# Patient Record
Sex: Female | Born: 1935 | Race: Black or African American | Hispanic: No | Smoking: Never smoker
Health system: Southern US, Community
[De-identification: ages and names within clinical notes are randomized; demographics above are authoritative.]

## PROBLEM LIST (undated history)

## (undated) DIAGNOSIS — I2699 Other pulmonary embolism without acute cor pulmonale: Secondary | ICD-10-CM

---

## 2015-07-30 ENCOUNTER — Emergency Department (HOSPITAL_COMMUNITY): Payer: Medicare HMO

## 2015-07-30 ENCOUNTER — Emergency Department (HOSPITAL_COMMUNITY)
Admission: EM | Admit: 2015-07-30 | Discharge: 2015-07-30 | Disposition: A | Discharge status: Home / Self Care | Payer: Medicare HMO | Attending: Emergency Medicine | Admitting: Emergency Medicine

## 2015-07-30 ENCOUNTER — Encounter (HOSPITAL_COMMUNITY): Payer: Self-pay | Admitting: Emergency Medicine

## 2015-07-30 DIAGNOSIS — Z7901 Long term (current) use of anticoagulants: Secondary | ICD-10-CM | POA: Diagnosis not present

## 2015-07-30 DIAGNOSIS — I2699 Other pulmonary embolism without acute cor pulmonale: Secondary | ICD-10-CM | POA: Diagnosis not present

## 2015-07-30 DIAGNOSIS — R531 Weakness: Secondary | ICD-10-CM | POA: Diagnosis present

## 2015-07-30 DIAGNOSIS — R0602 Shortness of breath: Secondary | ICD-10-CM

## 2015-07-30 HISTORY — DX: Other pulmonary embolism without acute cor pulmonale: I26.99

## 2015-07-30 LAB — HEPATIC FUNCTION PANEL
ALT: 25 U/L (ref 14–54)
AST: 27 U/L (ref 15–41)
Albumin: 3.9 g/dL (ref 3.5–5.0)
Alkaline Phosphatase: 56 U/L (ref 38–126)
BILIRUBIN DIRECT: 0.1 mg/dL (ref 0.1–0.5)
BILIRUBIN INDIRECT: 0.4 mg/dL (ref 0.3–0.9)
BILIRUBIN TOTAL: 0.5 mg/dL (ref 0.3–1.2)
Total Protein: 7.7 g/dL (ref 6.5–8.1)

## 2015-07-30 LAB — CBC
HEMATOCRIT: 43.6 % (ref 36.0–46.0)
HEMOGLOBIN: 14.2 g/dL (ref 12.0–15.0)
MCH: 29.5 pg (ref 26.0–34.0)
MCHC: 32.6 g/dL (ref 30.0–36.0)
MCV: 90.5 fL (ref 78.0–100.0)
Platelets: 341 10*3/uL (ref 150–400)
RBC: 4.82 MIL/uL (ref 3.87–5.11)
RDW: 14.2 % (ref 11.5–15.5)
WBC: 8.8 10*3/uL (ref 4.0–10.5)

## 2015-07-30 LAB — BASIC METABOLIC PANEL
Anion gap: 9 (ref 5–15)
BUN: 14 mg/dL (ref 6–20)
CHLORIDE: 103 mmol/L (ref 101–111)
CO2: 27 mmol/L (ref 22–32)
CREATININE: 1.03 mg/dL — AB (ref 0.44–1.00)
Calcium: 10.2 mg/dL (ref 8.9–10.3)
GFR calc non Af Amer: 51 mL/min — ABNORMAL LOW (ref >60)
GFR, EST AFRICAN AMERICAN: 59 mL/min — AB (ref >60)
Glucose, Bld: 116 mg/dL — ABNORMAL HIGH (ref 65–99)
POTASSIUM: 3.9 mmol/L (ref 3.5–5.1)
Sodium: 139 mmol/L (ref 135–145)

## 2015-07-30 LAB — TROPONIN I: Troponin I: <0.03 ng/mL (ref <0.031)

## 2015-07-30 NOTE — ED Notes (Signed)
Per pt/family-just released from [facility] yesterday-diagnosed with PEs-states she had a bad coughing fit last night-states increased weakness-called PCP and tole her to come here

## 2015-07-30 NOTE — ED Provider Notes (Signed)
CSN: [id number]     Arrival date & time 07/30/15  1234 History   First MD Initiated Contact with Patient 07/30/15 1815     Chief Complaint  Patient presents with  . Weakness     (Consider location/radiation/quality/duration/timing/severity/associated sxs/prior Treatment) HPI Comments: The patient is a 79 year old female, she was recently admitted to an outside hospital with a pulmonary embolism after she was admitted for generalized weakness. The patient states that every time she gets up to walk she feels short of breath and a chest heaviness which immediately goes away when she rests. She lives by herself, she is living by herself for several years, she lives in a one floor house and has no difficulty ambulating except for this difficulty with breathing. She denies any radiation to her neck or her arm, she denies chest pain or shortness of breath at rest, she has no hypertension diabetes hypercholesterolemia and does not smoke cigarettes. She has never had any heart problems in the past, she states that they looked at her heart but she was in the hospital and states that it was normal. The patient denies having any swelling of the legs or any other complaints. She was started on Xarelto at the hospital and continues to take it. It was recommended by her family doctor that she come here for evaluation as she was still having symptoms. At this time the patient has no symptoms.  Patient is a 79 y.o. female presenting with weakness. The history is provided by the patient.  Weakness    Past Medical History  Diagnosis Date  . PE (pulmonary embolism)    History reviewed. No pertinent past surgical history. No family history on file. Social History  Substance Use Topics  . Smoking status: Never Smoker   . Smokeless tobacco: None  . Alcohol Use: No   OB History    No data available     Review of Systems  Neurological: Positive for weakness.  All other systems reviewed and are  negative.     Allergies  Review of patient's allergies indicates no known allergies.  Home Medications   Prior to Admission medications   Medication Sig Start Date End Date Taking? Authorizing Provider  Rivaroxaban (XARELTO) 15 MG TABS tablet Take 15 mg by mouth 2 (two) times daily with a meal.   Yes Historical Provider, MD   BP 144/78 mmHg  Pulse 70  Temp(Src) 98.3 F (36.8 C) (Oral)  Resp 16  SpO2 97% Physical Exam  Constitutional: She appears well-developed and well-nourished. No distress.  HENT:  Head: Normocephalic and atraumatic.  Mouth/Throat: Oropharynx is clear and moist. No oropharyngeal exudate.  Eyes: Conjunctivae and EOM are normal. Pupils are equal, round, and reactive to light. Right eye exhibits no discharge. Left eye exhibits no discharge. No scleral icterus.  Neck: Normal range of motion. Neck supple. No JVD present. No thyromegaly present.  Cardiovascular: Normal rate, regular rhythm, normal heart sounds and intact distal pulses.  Exam reveals no gallop and no friction rub.   No murmur heard. Pulmonary/Chest: Effort normal and breath sounds normal. No respiratory distress. She has no wheezes. She has no rales.  Abdominal: Soft. Bowel sounds are normal. She exhibits no distension and no mass. There is no tenderness.  Musculoskeletal: Normal range of motion. She exhibits no edema or tenderness.  Lymphadenopathy:    She has no cervical adenopathy.  Neurological: She is alert. Coordination normal.  Skin: Skin is warm and dry. No rash noted. No erythema.  Psychiatric: She has a normal mood and affect. Her behavior is normal.  Nursing note and vitals reviewed.   ED Course  Procedures (including critical care time) Labs Review Labs Reviewed  BASIC METABOLIC PANEL - Abnormal; Notable for the following:    Glucose, Bld 116 (*)    Creatinine, Ser 1.03 (*)    GFR calc non Af Amer 51 (*)    GFR calc Af Amer 59 (*)    All other components within normal limits   CBC  HEPATIC FUNCTION PANEL  TROPONIN I    Imaging Review Dg Chest 2 View  07/30/2015  CLINICAL DATA:  Weakness shortness of breath. Cough for 3-4 weeks. Diagnosis of pulmonary embolism 3 days ago at [facility]. EXAM: CHEST - 2 VIEW COMPARISON:  [facility] FINDINGS: The heart size is normal. The lungs are clear. Mild hyperinflation is stable. There is no edema or effusion. No focal airspace consolidation is present. The visualized soft tissues and bony thorax are unremarkable. IMPRESSION: No acute cardiopulmonary disease or significant interval change. Electronically Signed   By: Marin Robertshristopher  Mattern M.D.   On: 07/30/2015 14:09   I have personally reviewed and evaluated these images and lab results as part of my medical decision-making.   EKG Interpretation   Date/Time:  Tuesday July 30 2015 12:46:52 EST Ventricular Rate:  71 PR Interval:  140 QRS Duration: 69 QT Interval:  408 QTC Calculation: 443 R Axis:   -8 Text Interpretation:  Sinus rhythm Multiple ventricular premature  complexes Left atrial enlargement No old tracing to compare Confirmed by  Eshika Reckart  MD, Felcia Huebert ([id number]) on 07/30/2015 6:17:25 PM      MDM   Final diagnoses:  Shortness of breath  Other pulmonary embolism without acute cor pulmonale, unspecified chronicity (HCC)    No tachycardia, soft nontender abdomen, no edema, clear lung sounds. X-ray is negative, labs so far are normal, reviewed CT scan of the chest abdomen and pelvis from the outside hospital showing pulmonary embolus him in the right lung, also showing no acute findings in the abdomen and pelvis. Her vital signs are normal, the patient should be stable for discharge, I have discussed this extensively with the family and the patient, they agreed to have her live with them for the next couple of weeks while she gradually improves. I suspect that her dyspnea and chest discomfort is related to the pulmonary embolism  and not to a primary cardiac disease. Her EKG is unremarkable.  The patient has normal lab values, normal x-ray, normal vital signs, she appears comfortable and well rested and is stable for discharge. She will be discharged in continuing to take her Xarelto. She and her daughter are in agreement that this is the right choice, she can follow-up in the outpatient setting.    Eber HongBrian Jeremie Abdelaziz, MD 07/30/15 [phone]

## 2015-07-30 NOTE — Discharge Instructions (Signed)
Your testing was normal Normal blood tests and xray See your doctor in 48 hours  Please obtain all of your results from medical records or have your doctors office obtain the results - share them with your doctor - you should be seen at your doctors office in the next 2 days. Call today to arrange your follow up. Take the medications as prescribed. Please review all of the medicines and only take them if you do not have an allergy to them. Please be aware that if you are taking birth control pills, taking other prescriptions, ESPECIALLY ANTIBIOTICS may make the birth control ineffective - if this is the case, either do not engage in sexual activity or use alternative methods of birth control such as condoms until you have finished the medicine and your family doctor says it is OK to restart them. If you are on a blood thinner such as COUMADIN, be aware that any other medicine that you take may cause the coumadin to either work too much, or not enough - you should have your coumadin level rechecked in next 7 days if this is the case.  ?  It is also a possibility that you have an allergic reaction to any of the medicines that you have been prescribed - Everybody reacts differently to medications and while MOST people have no trouble with most medicines, you may have a reaction such as nausea, vomiting, rash, swelling, shortness of breath. If this is the case, please stop taking the medicine immediately and contact your physician.  ?  You should return to the ER if you develop severe or worsening symptoms.

## 2015-09-18 DIAGNOSIS — I2699 Other pulmonary embolism without acute cor pulmonale: Secondary | ICD-10-CM | POA: Insufficient documentation

## 2015-09-18 DIAGNOSIS — I313 Pericardial effusion (noninflammatory): Secondary | ICD-10-CM | POA: Insufficient documentation

## 2015-09-18 DIAGNOSIS — I3139 Other pericardial effusion (noninflammatory): Secondary | ICD-10-CM | POA: Insufficient documentation

## 2015-10-04 DIAGNOSIS — I10 Essential (primary) hypertension: Secondary | ICD-10-CM | POA: Insufficient documentation

## 2015-11-06 DIAGNOSIS — R5383 Other fatigue: Secondary | ICD-10-CM | POA: Insufficient documentation

## 2015-11-06 DIAGNOSIS — G4700 Insomnia, unspecified: Secondary | ICD-10-CM | POA: Insufficient documentation

## 2015-11-06 DIAGNOSIS — I517 Cardiomegaly: Secondary | ICD-10-CM | POA: Insufficient documentation

## 2015-11-06 DIAGNOSIS — I2510 Atherosclerotic heart disease of native coronary artery without angina pectoris: Secondary | ICD-10-CM | POA: Insufficient documentation

## 2015-11-06 DIAGNOSIS — M1711 Unilateral primary osteoarthritis, right knee: Secondary | ICD-10-CM | POA: Insufficient documentation

## 2015-11-06 DIAGNOSIS — M25552 Pain in left hip: Secondary | ICD-10-CM | POA: Insufficient documentation

## 2015-11-06 DIAGNOSIS — M1610 Unilateral primary osteoarthritis, unspecified hip: Secondary | ICD-10-CM | POA: Insufficient documentation

## 2015-11-27 DIAGNOSIS — R42 Dizziness and giddiness: Secondary | ICD-10-CM | POA: Insufficient documentation

## 2016-01-09 DIAGNOSIS — R350 Frequency of micturition: Secondary | ICD-10-CM | POA: Insufficient documentation

## 2016-01-09 DIAGNOSIS — Z7901 Long term (current) use of anticoagulants: Secondary | ICD-10-CM | POA: Insufficient documentation

## 2016-01-16 DIAGNOSIS — R0982 Postnasal drip: Secondary | ICD-10-CM

## 2016-01-16 DIAGNOSIS — J309 Allergic rhinitis, unspecified: Secondary | ICD-10-CM | POA: Insufficient documentation

## 2016-03-05 DIAGNOSIS — H9312 Tinnitus, left ear: Secondary | ICD-10-CM | POA: Insufficient documentation

## 2016-07-08 DIAGNOSIS — M7918 Myalgia, other site: Secondary | ICD-10-CM | POA: Insufficient documentation

## 2016-07-08 DIAGNOSIS — R1013 Epigastric pain: Secondary | ICD-10-CM | POA: Insufficient documentation

## 2016-07-19 DIAGNOSIS — F0390 Unspecified dementia without behavioral disturbance: Secondary | ICD-10-CM | POA: Insufficient documentation

## 2016-08-20 DIAGNOSIS — I480 Paroxysmal atrial fibrillation: Secondary | ICD-10-CM | POA: Insufficient documentation

## 2016-08-20 DIAGNOSIS — M79621 Pain in right upper arm: Secondary | ICD-10-CM | POA: Insufficient documentation

## 2017-05-18 DIAGNOSIS — R1031 Right lower quadrant pain: Secondary | ICD-10-CM | POA: Insufficient documentation

## 2017-07-22 IMAGING — CR DG CHEST 2V
2 series · 2 of 2 positions shown · non-contrast
Comparison: [facility]

CLINICAL DATA: Weakness shortness of breath. Cough for 3-4 weeks.
Diagnosis of pulmonary embolism 3 days ago at [facility].

EXAM:
CHEST - 2 VIEW

[w chest pa]
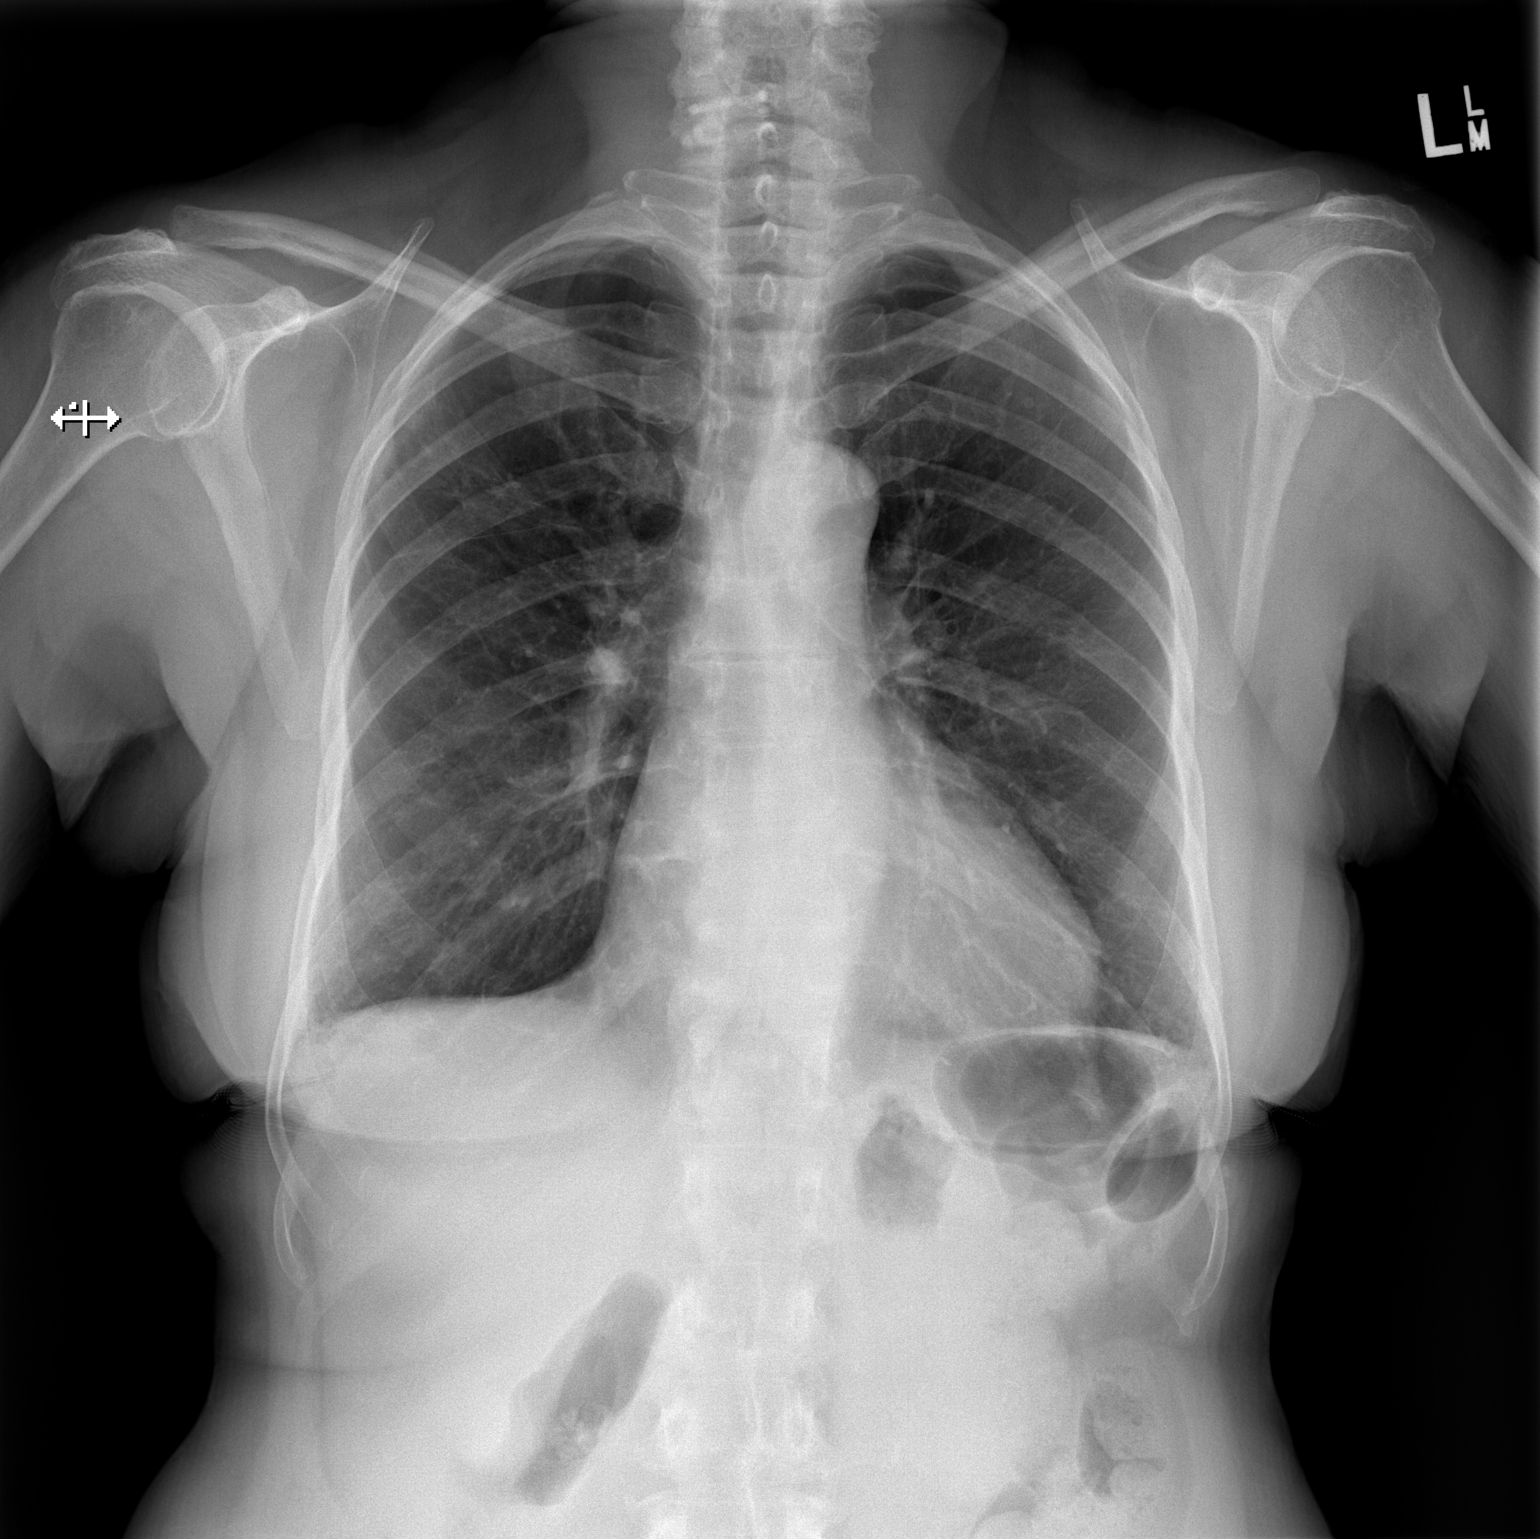

[w chest lat]
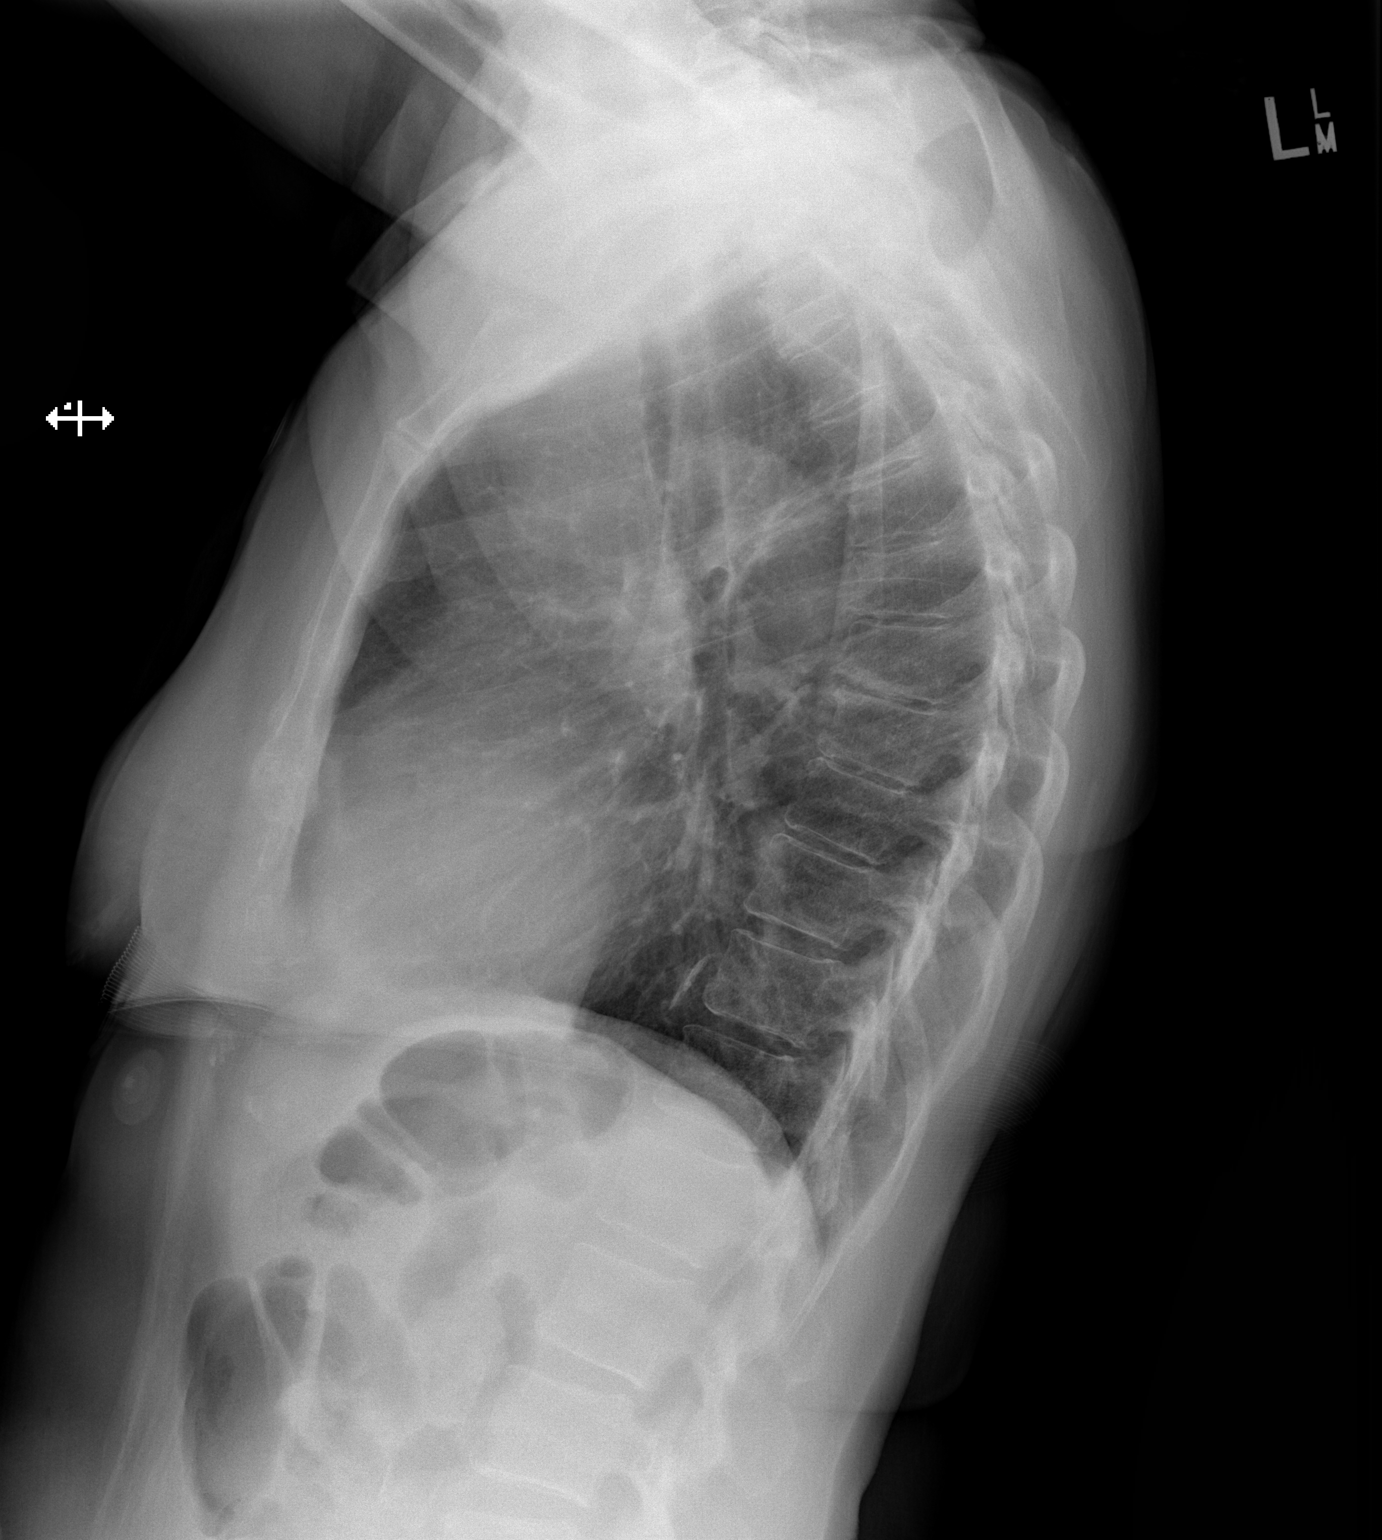

[2 of 2 positions shown; findings below may reference images not displayed]

FINDINGS: The heart size is normal. The lungs are clear. Mild hyperinflation
is stable. There is no edema or effusion. No focal airspace
consolidation is present. The visualized soft tissues and bony
thorax are unremarkable.
IMPRESSION: No acute cardiopulmonary disease or significant interval change.

## 2017-09-22 DIAGNOSIS — G8929 Other chronic pain: Secondary | ICD-10-CM | POA: Insufficient documentation

## 2017-09-22 DIAGNOSIS — M546 Pain in thoracic spine: Secondary | ICD-10-CM

## 2017-12-17 DIAGNOSIS — M25552 Pain in left hip: Secondary | ICD-10-CM

## 2017-12-17 DIAGNOSIS — M25551 Pain in right hip: Secondary | ICD-10-CM | POA: Insufficient documentation

## 2018-04-07 DIAGNOSIS — R079 Chest pain, unspecified: Secondary | ICD-10-CM | POA: Insufficient documentation

## 2018-05-03 DIAGNOSIS — E785 Hyperlipidemia, unspecified: Secondary | ICD-10-CM | POA: Insufficient documentation

## 2018-07-20 DIAGNOSIS — K2100 Gastro-esophageal reflux disease with esophagitis, without bleeding: Secondary | ICD-10-CM | POA: Insufficient documentation

## 2018-09-11 DIAGNOSIS — I160 Hypertensive urgency: Secondary | ICD-10-CM | POA: Diagnosis not present

## 2018-09-11 DIAGNOSIS — Z7901 Long term (current) use of anticoagulants: Secondary | ICD-10-CM | POA: Diagnosis not present

## 2018-09-11 DIAGNOSIS — R079 Chest pain, unspecified: Secondary | ICD-10-CM | POA: Diagnosis not present

## 2018-09-11 DIAGNOSIS — I4891 Unspecified atrial fibrillation: Secondary | ICD-10-CM | POA: Diagnosis not present

## 2018-09-12 ENCOUNTER — Ambulatory Visit: Payer: Medicare HMO | Admitting: Cardiology

## 2018-09-12 DIAGNOSIS — Z86711 Personal history of pulmonary embolism: Secondary | ICD-10-CM | POA: Insufficient documentation

## 2018-09-12 DIAGNOSIS — R079 Chest pain, unspecified: Secondary | ICD-10-CM | POA: Diagnosis not present

## 2018-09-20 DIAGNOSIS — K921 Melena: Secondary | ICD-10-CM | POA: Diagnosis not present

## 2018-09-20 DIAGNOSIS — R1084 Generalized abdominal pain: Secondary | ICD-10-CM | POA: Diagnosis not present

## 2018-09-22 DIAGNOSIS — K921 Melena: Secondary | ICD-10-CM | POA: Diagnosis not present

## 2018-09-26 ENCOUNTER — Ambulatory Visit: Payer: Self-pay | Admitting: Cardiology

## 2018-09-28 DIAGNOSIS — K921 Melena: Secondary | ICD-10-CM | POA: Diagnosis not present

## 2018-09-28 DIAGNOSIS — K2950 Unspecified chronic gastritis without bleeding: Secondary | ICD-10-CM | POA: Diagnosis not present

## 2018-09-28 DIAGNOSIS — R1084 Generalized abdominal pain: Secondary | ICD-10-CM | POA: Diagnosis not present

## 2018-09-28 DIAGNOSIS — K2930 Chronic superficial gastritis without bleeding: Secondary | ICD-10-CM | POA: Diagnosis not present

## 2018-09-28 DIAGNOSIS — R1013 Epigastric pain: Secondary | ICD-10-CM | POA: Diagnosis not present

## 2018-10-03 DIAGNOSIS — R1084 Generalized abdominal pain: Secondary | ICD-10-CM | POA: Diagnosis not present

## 2018-10-03 DIAGNOSIS — K824 Cholesterolosis of gallbladder: Secondary | ICD-10-CM | POA: Diagnosis not present

## 2018-11-04 DIAGNOSIS — I2510 Atherosclerotic heart disease of native coronary artery without angina pectoris: Secondary | ICD-10-CM | POA: Diagnosis not present

## 2018-11-04 DIAGNOSIS — E785 Hyperlipidemia, unspecified: Secondary | ICD-10-CM | POA: Diagnosis not present

## 2018-11-04 DIAGNOSIS — Z7901 Long term (current) use of anticoagulants: Secondary | ICD-10-CM | POA: Diagnosis not present

## 2018-11-04 DIAGNOSIS — I313 Pericardial effusion (noninflammatory): Secondary | ICD-10-CM | POA: Diagnosis not present

## 2018-11-04 DIAGNOSIS — I2782 Chronic pulmonary embolism: Secondary | ICD-10-CM | POA: Diagnosis not present

## 2018-11-04 DIAGNOSIS — I480 Paroxysmal atrial fibrillation: Secondary | ICD-10-CM | POA: Diagnosis not present

## 2018-11-10 DIAGNOSIS — I313 Pericardial effusion (noninflammatory): Secondary | ICD-10-CM | POA: Diagnosis not present

## 2018-11-16 DIAGNOSIS — R070 Pain in throat: Secondary | ICD-10-CM | POA: Diagnosis not present

## 2018-11-16 DIAGNOSIS — R1310 Dysphagia, unspecified: Secondary | ICD-10-CM | POA: Diagnosis not present

## 2018-11-16 DIAGNOSIS — J342 Deviated nasal septum: Secondary | ICD-10-CM | POA: Diagnosis not present

## 2019-02-02 DIAGNOSIS — Z87891 Personal history of nicotine dependence: Secondary | ICD-10-CM | POA: Diagnosis not present

## 2019-02-02 DIAGNOSIS — M79622 Pain in left upper arm: Secondary | ICD-10-CM | POA: Diagnosis not present

## 2019-02-09 DIAGNOSIS — Z87891 Personal history of nicotine dependence: Secondary | ICD-10-CM | POA: Diagnosis not present

## 2019-02-09 DIAGNOSIS — Z0000 Encounter for general adult medical examination without abnormal findings: Secondary | ICD-10-CM | POA: Diagnosis not present

## 2019-02-09 DIAGNOSIS — M79621 Pain in right upper arm: Secondary | ICD-10-CM | POA: Diagnosis not present

## 2019-02-09 DIAGNOSIS — F0390 Unspecified dementia without behavioral disturbance: Secondary | ICD-10-CM | POA: Diagnosis not present

## 2019-02-09 DIAGNOSIS — M79622 Pain in left upper arm: Secondary | ICD-10-CM | POA: Diagnosis not present

## 2019-02-16 DIAGNOSIS — M542 Cervicalgia: Secondary | ICD-10-CM | POA: Diagnosis not present

## 2019-02-16 DIAGNOSIS — M5412 Radiculopathy, cervical region: Secondary | ICD-10-CM | POA: Diagnosis not present

## 2019-02-27 DIAGNOSIS — N390 Urinary tract infection, site not specified: Secondary | ICD-10-CM | POA: Diagnosis not present

## 2019-02-27 DIAGNOSIS — Z7901 Long term (current) use of anticoagulants: Secondary | ICD-10-CM | POA: Diagnosis not present

## 2019-02-27 DIAGNOSIS — R300 Dysuria: Secondary | ICD-10-CM | POA: Diagnosis not present

## 2019-02-27 DIAGNOSIS — R319 Hematuria, unspecified: Secondary | ICD-10-CM | POA: Diagnosis not present

## 2019-03-10 DIAGNOSIS — Z87891 Personal history of nicotine dependence: Secondary | ICD-10-CM | POA: Diagnosis not present

## 2019-03-10 DIAGNOSIS — I313 Pericardial effusion (noninflammatory): Secondary | ICD-10-CM | POA: Diagnosis not present

## 2019-03-10 DIAGNOSIS — Z7901 Long term (current) use of anticoagulants: Secondary | ICD-10-CM | POA: Diagnosis not present

## 2019-03-10 DIAGNOSIS — E785 Hyperlipidemia, unspecified: Secondary | ICD-10-CM | POA: Diagnosis not present

## 2019-03-10 DIAGNOSIS — I2510 Atherosclerotic heart disease of native coronary artery without angina pectoris: Secondary | ICD-10-CM | POA: Diagnosis not present

## 2019-03-10 DIAGNOSIS — I480 Paroxysmal atrial fibrillation: Secondary | ICD-10-CM | POA: Diagnosis not present

## 2019-05-01 DIAGNOSIS — R002 Palpitations: Secondary | ICD-10-CM | POA: Diagnosis not present

## 2022-10-27 DIAGNOSIS — R0789 Other chest pain: Secondary | ICD-10-CM | POA: Diagnosis not present

## 2022-10-27 DIAGNOSIS — G8929 Other chronic pain: Secondary | ICD-10-CM | POA: Diagnosis not present

## 2022-10-27 DIAGNOSIS — M545 Low back pain, unspecified: Secondary | ICD-10-CM | POA: Diagnosis not present

## 2023-02-05 DIAGNOSIS — I3139 Other pericardial effusion (noninflammatory): Secondary | ICD-10-CM | POA: Diagnosis not present

## 2023-02-05 DIAGNOSIS — I2510 Atherosclerotic heart disease of native coronary artery without angina pectoris: Secondary | ICD-10-CM | POA: Diagnosis not present

## 2023-02-05 DIAGNOSIS — E785 Hyperlipidemia, unspecified: Secondary | ICD-10-CM | POA: Diagnosis not present

## 2023-02-05 DIAGNOSIS — I480 Paroxysmal atrial fibrillation: Secondary | ICD-10-CM | POA: Diagnosis not present

## 2023-02-05 DIAGNOSIS — I2699 Other pulmonary embolism without acute cor pulmonale: Secondary | ICD-10-CM | POA: Diagnosis not present

## 2023-02-05 DIAGNOSIS — Z7901 Long term (current) use of anticoagulants: Secondary | ICD-10-CM | POA: Diagnosis not present

## 2023-02-18 DIAGNOSIS — E7800 Pure hypercholesterolemia, unspecified: Secondary | ICD-10-CM | POA: Diagnosis not present

## 2023-02-18 DIAGNOSIS — I480 Paroxysmal atrial fibrillation: Secondary | ICD-10-CM | POA: Diagnosis not present

## 2023-02-18 DIAGNOSIS — Z0000 Encounter for general adult medical examination without abnormal findings: Secondary | ICD-10-CM | POA: Diagnosis not present

## 2023-02-18 DIAGNOSIS — Z7901 Long term (current) use of anticoagulants: Secondary | ICD-10-CM | POA: Diagnosis not present
